# Patient Record
Sex: Female | Born: 1975 | Race: White | Hispanic: No | Marital: Married | Smoking: Never smoker
Health system: Southern US, Community
[De-identification: ages and names within clinical notes are randomized; demographics above are authoritative.]

## PROBLEM LIST (undated history)

## (undated) DIAGNOSIS — F909 Attention-deficit hyperactivity disorder, unspecified type: Secondary | ICD-10-CM

## (undated) DIAGNOSIS — F329 Major depressive disorder, single episode, unspecified: Secondary | ICD-10-CM

## (undated) DIAGNOSIS — I10 Essential (primary) hypertension: Secondary | ICD-10-CM

## (undated) DIAGNOSIS — F419 Anxiety disorder, unspecified: Secondary | ICD-10-CM

## (undated) DIAGNOSIS — G43909 Migraine, unspecified, not intractable, without status migrainosus: Secondary | ICD-10-CM

## (undated) DIAGNOSIS — K760 Fatty (change of) liver, not elsewhere classified: Secondary | ICD-10-CM

## (undated) HISTORY — DX: Anxiety disorder, unspecified: F41.9

## (undated) HISTORY — DX: Attention-deficit hyperactivity disorder, unspecified type: F90.9

## (undated) HISTORY — DX: Fatty (change of) liver, not elsewhere classified: K76.0

## (undated) HISTORY — DX: Essential (primary) hypertension: I10

## (undated) HISTORY — DX: Migraine, unspecified, not intractable, without status migrainosus: G43.909

---
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
## 1997-06-28 ENCOUNTER — Other Ambulatory Visit: Admission: RE | Admit: 1997-06-28 | Discharge: 1997-06-28 | Payer: Self-pay | Admitting: Obstetrics and Gynecology

## 1998-07-13 ENCOUNTER — Other Ambulatory Visit: Admission: RE | Admit: 1998-07-13 | Discharge: 1998-07-13 | Payer: Self-pay | Admitting: Obstetrics and Gynecology

## 1999-10-12 ENCOUNTER — Other Ambulatory Visit: Admission: RE | Admit: 1999-10-12 | Discharge: 1999-10-12 | Payer: Self-pay | Admitting: Obstetrics and Gynecology

## 2000-10-28 ENCOUNTER — Other Ambulatory Visit: Admission: RE | Admit: 2000-10-28 | Discharge: 2000-10-28 | Payer: Self-pay | Admitting: Obstetrics and Gynecology

## 2002-02-16 ENCOUNTER — Other Ambulatory Visit: Admission: RE | Admit: 2002-02-16 | Discharge: 2002-02-16 | Payer: Self-pay | Admitting: Gynecology

## 2003-10-26 ENCOUNTER — Other Ambulatory Visit: Admission: RE | Admit: 2003-10-26 | Discharge: 2003-10-26 | Payer: Self-pay | Admitting: Gynecology

## 2010-08-23 ENCOUNTER — Other Ambulatory Visit: Payer: Self-pay | Admitting: Family Medicine

## 2010-08-23 ENCOUNTER — Ambulatory Visit
Admission: RE | Admit: 2010-08-23 | Discharge: 2010-08-23 | Disposition: A | Discharge status: Home / Self Care | Payer: Commercial Managed Care - PPO | Source: Ambulatory Visit | Attending: Family Medicine | Admitting: Family Medicine

## 2010-08-23 DIAGNOSIS — R609 Edema, unspecified: Secondary | ICD-10-CM

## 2011-10-07 ENCOUNTER — Ambulatory Visit: Payer: BC Managed Care – PPO | Admitting: Physician Assistant

## 2011-10-07 VITALS — BP 124/84 | HR 80 | Temp 98.0°F | Resp 16 | Ht 64.5 in | Wt 238.0 lb

## 2011-10-07 DIAGNOSIS — R300 Dysuria: Secondary | ICD-10-CM

## 2011-10-07 DIAGNOSIS — R350 Frequency of micturition: Secondary | ICD-10-CM

## 2011-10-07 LAB — POCT UA - MICROSCOPIC ONLY
Casts, Ur, LPF, POC: NEGATIVE
Crystals, Ur, HPF, POC: NEGATIVE
Yeast, UA: NEGATIVE

## 2011-10-07 LAB — POCT URINALYSIS DIPSTICK
Nitrite, UA: NEGATIVE
Urobilinogen, UA: 0.2
pH, UA: 6.5

## 2011-10-07 MED ORDER — FLUCONAZOLE 150 MG PO TABS: 150.0000 mg | ORAL_TABLET | Freq: Once | ORAL | Status: DC

## 2011-10-07 MED ORDER — CIPROFLOXACIN HCL 500 MG PO TABS: 500.0000 mg | ORAL_TABLET | Freq: Two times a day (BID) | ORAL | Status: DC

## 2011-10-07 NOTE — Progress Notes (Signed)
  Subjective:    Patient ID: Tammy Bishop, female    DOB: [birth year], 36 y.o.   MRN: [id number]  HPI 36 year old female presents with 1 week history of urinary frequency, dysuria, and slight nausea. States symptoms initially started several weeks ago and she thought it may be a yeast infection and was treated for that.  Says symptoms improved initially and then returned.  Denies fever, chills, abdominal pain, back pain, or vomiting.    She also complains of a small lump on the right side of her neck. States it has been there for about 3 months. Non-tender, no warmth, erythema, or drainage.  Denies weight loss, bruising/bleeding or fatigue.  She otherwise is healthy.     Review of Systems  Constitutional: Negative for fever, chills and unexpected weight change.  HENT: Negative for trouble swallowing, neck pain and neck stiffness.   Gastrointestinal: Positive for nausea. Negative for vomiting and abdominal pain.  Genitourinary: Positive for dysuria, urgency and frequency. Negative for flank pain, vaginal pain and pelvic pain.  Skin: Negative for rash.  Neurological: Negative for headaches.  Hematological: Does not bruise/bleed easily.       Objective:   Physical Exam  Constitutional: She is oriented to person, place, and time. She appears well-developed and well-nourished.  HENT:  Head: Normocephalic and atraumatic.  Right Ear: External ear normal.  Left Ear: External ear normal.  Eyes: Conjunctivae normal are normal.  Neck: Normal range of motion. Neck supple.    Cardiovascular: Normal rate, regular rhythm and normal heart sounds.   Pulmonary/Chest: Effort normal and breath sounds normal.  Abdominal: Soft. Bowel sounds are normal. There is no tenderness (no CVA tenderness bilaterally). There is no rebound and no guarding.  Neurological: She is alert and oriented to person, place, and time.  Psychiatric: She has a normal mood and affect. Her behavior is normal. Judgment and  thought content normal.     Results for orders placed in visit on 10/07/11  POCT URINALYSIS DIPSTICK      Component Value Range   Color, UA yellow     Clarity, UA clear     Glucose, UA negative     Bilirubin, UA negative     Ketones, UA negative     Spec Grav, UA <=1.005     Blood, UA moderate     pH, UA 6.5     Protein, UA negative     Urobilinogen, UA 0.2     Nitrite, UA negative     Leukocytes, UA Trace    POCT UA - MICROSCOPIC ONLY      Component Value Range   WBC, Ur, HPF, POC 1-2     RBC, urine, microscopic 0-1     Bacteria, U Microscopic 1+     Mucus, UA negative     Epithelial cells, urine per micros 0-4     Crystals, Ur, HPF, POC negative     Casts, Ur, LPF, POC negative     Yeast, UA negative          Assessment & Plan:   1. Urinary frequency  POCT urinalysis dipstick, POCT UA - Microscopic Only, ciprofloxacin (CIPRO) 500 MG tablet, Urine culture  Start Cipro 500 mg bid Urine culture sent Rx for diflucan given to cover antibiotic associated yeast infection Follow up if symptoms worsen or fail to improve.

## 2011-10-10 LAB — URINE CULTURE: Colony Count: >=100000

## 2018-07-10 ENCOUNTER — Encounter (INDEPENDENT_AMBULATORY_CARE_PROVIDER_SITE_OTHER): Payer: Self-pay | Admitting: Ophthalmology

## 2018-07-10 ENCOUNTER — Other Ambulatory Visit: Payer: Self-pay

## 2018-07-10 ENCOUNTER — Ambulatory Visit (INDEPENDENT_AMBULATORY_CARE_PROVIDER_SITE_OTHER): Payer: Managed Care, Other (non HMO) | Admitting: Ophthalmology

## 2018-07-10 DIAGNOSIS — D3132 Benign neoplasm of left choroid: Secondary | ICD-10-CM | POA: Diagnosis not present

## 2018-07-10 DIAGNOSIS — H3581 Retinal edema: Secondary | ICD-10-CM | POA: Diagnosis not present

## 2018-07-10 DIAGNOSIS — G43109 Migraine with aura, not intractable, without status migrainosus: Secondary | ICD-10-CM | POA: Diagnosis not present

## 2018-07-10 DIAGNOSIS — H2513 Age-related nuclear cataract, bilateral: Secondary | ICD-10-CM | POA: Diagnosis not present

## 2018-07-10 NOTE — Progress Notes (Addendum)
[facility] Note  07/10/2018     CHIEF COMPLAINT Patient presents for Retina Evaluation   HISTORY OF PRESENT ILLNESS: Tammy Bishop is a 43 y.o. female who presents to the clinic today for:   HPI    Retina Evaluation    In both eyes.  This started 1 week ago.  Duration of 1 week.  Associated Symptoms Flashes, Floaters and Photophobia.  Context:  distance vision, mid-range vision, near vision, computer work and watching TV.  Treatments tried include no treatments.  I, the attending physician,  performed the HPI with the patient and updated documentation appropriately.          Comments    43 y/o female pt referred by [facility] for eval of floaters OU.  About 1 wk ago while looking at a computer screen at work, pt noticed sudden onset translucent floaters OU that started small, would gradually grow in size and be surrounded by flickering lights.  VA OU would become blurred during this time as well.  Symptoms would last for about 30 mins before resolving on their own.  This has happened two more times within the past week.  Pt doesn't recall doing anything that would have triggered symptoms, and nothing quite like this has happened in the past.  Last occurred about 1 day ago.  Pt not reporting having any symptoms this morning.  Pt has hx of ophthalmic migraines.  Denies pain.  No gtts.       Last edited by Bernarda Caffey, MD on 07/10/2018 10:29 AM. (History)     Patient states computer screen looks blurry "just a little piece", followed by flashes that eventually cover 40 % of vision. Lasts about 15-20 minutes and then stops. Has had 4 episodes in the past week. Patient has history of migraines in middle/high school. Had headaches and auras in vision at that time. Was put on blood pressure medication to help control symptoms in middle/high school. No known hormonal changes at this time.       Referring physician: [facility] [place],  [place] [id number]  HISTORICAL INFORMATION:   Selected notes from the MEDICAL RECORD NUMBER    CURRENT MEDICATIONS: No current outpatient medications on file. (Ophthalmic Drugs)   No current facility-administered medications for this visit.  (Ophthalmic Drugs)   Current Outpatient Medications (Other)  Medication Sig  . ALPRAZolam (XANAX) 0.5 MG tablet Take 0.5 mg by mouth at bedtime as needed.  . ciprofloxacin (CIPRO) 500 MG tablet Take 1 tablet (500 mg total) by mouth 2 (two) times daily.  . fluconazole (DIFLUCAN) 150 MG tablet Take 1 tablet (150 mg total) by mouth once. Repeat if needed  . metoprolol succinate (TOPROL-XL) 100 MG 24 hr tablet Take 100 mg by mouth at bedtime.   No current facility-administered medications for this visit.  (Other)      REVIEW OF SYSTEMS: ROS    Positive for: Eyes   Negative for: Constitutional, Gastrointestinal, Neurological, Skin, Genitourinary, Musculoskeletal, HENT, Endocrine, Cardiovascular, Respiratory, Psychiatric, Allergic/Imm, Heme/Lymph   Last edited by Matthew Folks, COA on 07/10/2018  9:44 AM. (History)       ALLERGIES No Known Allergies  PAST MEDICAL HISTORY History reviewed. No pertinent past medical history. History reviewed. No pertinent surgical history.  FAMILY HISTORY History reviewed. No pertinent family history.  SOCIAL HISTORY Social History   Tobacco Use  . Smoking status: Never Smoker  Substance Use Topics  .  Alcohol use: Not on file  . Drug use: Not on file         OPHTHALMIC EXAM:  Base Eye Exam    Visual Acuity (Snellen - Linear)      Right Left   Dist cc 20/15 -2 20/15 -2   Correction: Glasses       Tonometry (Tonopen, 9:45 AM)      Right Left   Pressure 12 14       Pupils      Dark Light Shape React APD   Right 3 2 Round Brisk None   Left 3 2 Round Brisk None       Visual Fields (Counting fingers)      Left Right    Full Full       Extraocular  Movement      Right Left    Full, Ortho Full, Ortho       Neuro/Psych    Oriented x3: Yes   Mood/Affect: Normal       Dilation    Both eyes: 1.0% Mydriacyl, 2.5% Phenylephrine @ 9:45 AM        Slit Lamp and Fundus Exam    Slit Lamp Exam      Right Left   Lids/Lashes Telangiectasia, mild MGD Telangiectasia, mild MGD   Conjunctiva/Sclera White and quiet White and quiet   Cornea mild PEE mild PEE   Anterior Chamber deep and clear deep and clear   Iris round and dilated round and dilated   Lens 1+NS, 1+CS 1+NS, 1+CS   Vitreous trace syneresis trace syneresis       Fundus Exam      Right Left   Disc compact, pink and sharp trace tilt, pink and sharp   C/D Ratio 0.1 0.2   Macula flat, excellent foveal reflex, no heme or edema flat, excellent foveal reflex, no heme or edema, irregular slightly pigmented choroidal nevus superotemporal macula 3DD, no SRF, no orange pigment, essentially flat   Vessels mild attenuation mild attenuation   Periphery attached, no heme attached, no heme        Refraction    Wearing Rx      Sphere Cylinder Axis   Right -2.00 +1.25 011   Left -2.50 +2.25 004   Age: 66yr   Type: SVL       Manifest Refraction      Sphere Cylinder Axis Dist VA   Right -2.00 +1.25 011 20/15-2   Left -2.50 +2.25 004 20/15-2          IMAGING AND PROCEDURES  Imaging and Procedures for @TODAY @  OCT, Retina - OU - Both Eyes       Right Eye Quality was good. Central Foveal Thickness: 261. Progression has no prior data. Findings include normal foveal contour, no IRF, no SRF (Normal study).   Left Eye Quality was good. Central Foveal Thickness: 272. Progression has no prior data. Findings include normal foveal contour, no IRF, no SRF, myopic contour (Myopic contour, hyperreflective choroidal lesion with overlying drusen/PED caught on wide scan along distal ST arcades).   Notes *Images captured and stored on drive  Diagnosis / Impression:  OD: NFP, no  IRF/SRF OS:  NFP, no IRF/SRF,myopic contour, choroidal nevus distal ST arcade  Clinical management:  See below  Abbreviations: NFP - Normal foveal profile. CME - cystoid macular edema. PED - pigment epithelial detachment. IRF - intraretinal fluid. SRF - subretinal fluid. EZ - ellipsoid zone. ERM - epiretinal membrane. ORA - outer  retinal atrophy. ORT - outer retinal tubulation. SRHM - subretinal hyper-reflective material                 ASSESSMENT/PLAN:    ICD-10-CM   1. 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 Ocular migraine without headache  - pt reports history of ocular migraines w/ headache during teens and early 68s, thought to be related to hormonal changes -- followed with a neurologist at that time  - recent episodes involves visual phenomena (flashes/floaters/moving lights and colors) lasting minutes, but no associated headache  - dilated eye exam without structural or anatomic findings that correspond to symptoms  - recommend Neurology consult if symptoms persist or worsen for further evaluation and management  - no ophthalmologic intervention indicated at this time  - monitor  2. Choroidal Nevus OS  - ~3DD pigmented choroidal lesion in temporal macula with trace elevation (<0.13m)  - +drusen/PED overlying; no orange pigment or SRF  - discussed findings and prognosis  - baseline OCT and color images obtained today 7.3.2020  - recommend monitoring for change  - f/u in 3-4 months  3. No retinal edema on exam or OCT  4. NS OU - The symptoms of cataract, surgical options, and treatments and risks were discussed with patient. - discussed diagnosis and progression - not yet visually significant - monitor for now   Ophthalmic Meds Ordered this visit:  No orders of the defined types were placed in this encounter.      Return  3-4 months, for DFE, OCT.  There are no Patient Instructions on file for this visit.   Explained the diagnoses, plan, and follow up with the patient and they expressed understanding.  Patient expressed understanding of the importance of proper follow up care.   BGardiner Sleeper M.D., Ph.D. Diseases & Surgery of the Retina and Vitreous [place] I have reviewed the above documentation for accuracy and completeness, and I agree with the above. BGardiner Sleeper M.D., Ph.D. 07/10/18 1:36 PM     Abbreviations: M myopia (nearsighted); A astigmatism; H hyperopia (farsighted); P presbyopia; Mrx spectacle prescription;  CTL contact lenses; OD right eye; OS left eye; OU both eyes  XT exotropia; ET esotropia; PEK punctate epithelial keratitis; PEE punctate epithelial erosions; DES dry eye syndrome; MGD meibomian gland dysfunction; ATs artificial tears; PFAT's preservative free artificial tears; [place] sclerotic cataract; PSC posterior subcapsular cataract; ERM epi-retinal membrane; PVD posterior vitreous detachment; RD retinal detachment; DM diabetes mellitus; DR diabetic retinopathy; NPDR non-proliferative diabetic retinopathy; PDR proliferative diabetic retinopathy; CSME clinically significant macular edema; DME diabetic macular edema; dbh dot blot hemorrhages; CWS cotton wool spot; POAG primary open angle glaucoma; C/D cup-to-disc ratio; HVF humphrey visual field; GVF goldmann visual field; OCT optical coherence tomography; IOP intraocular pressure; BRVO Branch retinal vein occlusion; CRVO central retinal vein occlusion; CRAO central retinal artery occlusion; BRAO branch retinal artery occlusion; RT retinal tear; SB scleral buckle; PPV pars plana vitrectomy; VH Vitreous hemorrhage; PRP panretinal laser photocoagulation; IVK intravitreal kenalog; VMT vitreomacular traction; MH Macular hole;  NVD neovascularization of the disc; NVE neovascularization elsewhere; AREDS age related  eye disease study; ARMD age related macular degeneration; POAG primary open angle glaucoma; EBMD epithelial/anterior basement membrane dystrophy; ACIOL anterior chamber intraocular lens; IOL intraocular lens;  PCIOL posterior chamber intraocular lens; Phaco/IOL phacoemulsification with intraocular lens placement; [place] photorefractive keratectomy; LASIK laser assisted in situ keratomileusis; HTN hypertension; DM diabetes mellitus; COPD chronic obstructive pulmonary disease

## 2018-08-11 ENCOUNTER — Other Ambulatory Visit: Payer: Self-pay

## 2018-08-11 ENCOUNTER — Encounter: Payer: Self-pay | Admitting: Neurology

## 2018-08-11 ENCOUNTER — Encounter: Payer: Self-pay | Admitting: *Deleted

## 2018-08-11 ENCOUNTER — Ambulatory Visit: Payer: Managed Care, Other (non HMO) | Admitting: Neurology

## 2018-08-11 DIAGNOSIS — G43109 Migraine with aura, not intractable, without status migrainosus: Secondary | ICD-10-CM | POA: Diagnosis not present

## 2018-08-11 DIAGNOSIS — R21 Rash and other nonspecific skin eruption: Secondary | ICD-10-CM | POA: Diagnosis not present

## 2018-08-11 DIAGNOSIS — H539 Unspecified visual disturbance: Secondary | ICD-10-CM | POA: Diagnosis not present

## 2018-08-11 NOTE — Progress Notes (Signed)
[facility]  PATIENT: Tammy Bishop DOB: 1976-01-03  REFERRING DOCTOR OR PCP:  Dr. Dagmar Hait SOURCE: patient, notes from Dr. Dagmar Hait  _________________________________   HISTORICAL  CHIEF COMPLAINT:  Chief Complaint  Patient presents with   New Patient (Initial Visit)    RM 12, alone. Paper referral from Dr. Dagmar Hait for migraines/floaters in visual field.    Migraine    Occular migraines started in 7th/8th grade. After she hit puberty, she was ok. She then started [facility] after getting married and HA's started again until they found the right BC. She usually gets about 2-3 migraines per year but now they have increased again. Has had 5-6 episodes in the last month. Last one this past Monday. Has left sided pressure. She is sleeping ok and no big stressors in life. She recently started new job which has been less stressful.     HISTORY OF PRESENT ILLNESS:  I had the pleasure of seeing your patient, Tammy Bishop, at [facility] for neurologic consultation regarding her episodes of visual disturbance.  She is a 43 year old woman with intermittent visual scotomata.   She started getting migraine headaches at puberty.   Initially migraines were severe but improved in high school.  They worsened again after starting birth control but then has gone many years without many migraine.  In middle school, she would get visual aura but then migraines occurred without aura.  However, she has had multiple visual aura the last 4-5 weeks.  Most start while she is at work on the computer.   She then gets visual changes and has truble reading the central vision.  She sees flashing lights move slowly like a snake and their areas increases in her central vision.    She gets 15 minutes of visual aura and then vision resolved.   Often the visual changes followed by left temple pressure sensation.   She has had only 8-9 in total, 6-7 in front of a computer and 2 in front of a TV.        She has seen ophthalmology and her exam ws normal.    She has no recent change in medication but her metoprolol increased from 50 to 100 mg in February.     She developed a rash 3-4 weeks ago, shortly after the first week of visual episodes.   The rash is painful.   Pain is better and now itches more than hurts.   Rash never blistered.   REVIEW OF SYSTEMS: Constitutional: No fevers, chills, sweats, or change in appetite Eyes: No visual changes, double vision, eye pain Ear, nose and throat: No hearing loss, ear pain, nasal congestion, sore throat Cardiovascular: No chest pain, palpitations Respiratory: No shortness of breath at rest or with exertion.   No wheezes GastrointestinaI: No nausea, vomiting, diarrhea, abdominal pain, fecal incontinence Genitourinary: No dysuria, urinary retention or frequency.  No nocturia. Musculoskeletal: No neck pain, back pain Integumentary: Has rash, pruritus Neurological: as above Psychiatric: No depression at this time.  No anxiety Endocrine: No palpitations, diaphoresis, change in appetite, change in weigh or increased thirst Hematologic/Lymphatic: No anemia, purpura, petechiae. Allergic/Immunologic: No itchy/runny eyes, nasal congestion, recent allergic reactions, rashes  ALLERGIES: No Known Allergies  HOME MEDICATIONS:  Current Outpatient Medications:    ALPRAZolam (XANAX) 0.5 MG tablet, Take 0.5 mg by mouth at bedtime as needed., Disp: , Rfl:    ibuprofen (ADVIL) 200 MG tablet, Take 200 mg by mouth every 6 (six) hours as  needed., Disp: , Rfl:    Isometheptene-Caffeine-APAP (PRODRIN) 65-20-325 MG TABS, Take 1 tablet by mouth as needed., Disp: , Rfl:    metoprolol succinate (TOPROL-XL) 100 MG 24 hr tablet, Take 100 mg by mouth at bedtime., Disp: , Rfl:   PAST MEDICAL HISTORY: Past Medical History:  Diagnosis Date   ADHD    Anxiety and depression    Fatty liver    Hypertension    Migraines     PAST SURGICAL  HISTORY: Past Surgical History:  Procedure Laterality Date   CESAREAN SECTION  2006   MOUTH SURGERY  1994    FAMILY HISTORY: Family History  Problem Relation Age of Onset   Hypertension Mother    Diabetes type II Mother    Diabetes type II Father    Liver disease Father    Colon cancer Maternal Grandfather    Fibromyalgia Paternal Grandmother    Breast cancer Maternal Aunt     SOCIAL HISTORY:  Social History   Socioeconomic History   Marital status: Married    Spouse name: Randall Hiss   Number of children: 2   Years of education: Not on file   Highest education level: Not on file  Occupational History   Not on file  Social Needs   Financial resource strain: Not on file   Food insecurity    Worry: Not on file    Inability: Not on file   Transportation needs    Medical: Not on file    Non-medical: Not on file  Tobacco Use   Smoking status: Never Smoker   Smokeless tobacco: Never Used  Substance and Sexual Activity   Alcohol use: Yes    Comment: 4-5 drinks per month   Drug use: Not on file   Sexual activity: Not on file  Lifestyle   Physical activity    Days per week: Not on file    Minutes per session: Not on file   Stress: Not on file  Relationships   Social connections    Talks on phone: Not on file    Gets together: Not on file    Attends religious service: Not on file    Active member of club or organization: Not on file    Attends meetings of clubs or organizations: Not on file    Relationship status: Not on file   Intimate partner violence    Fear of current or ex partner: Not on file    Emotionally abused: Not on file    Physically abused: Not on file    Forced sexual activity: Not on file  Other Topics Concern   Not on file  Social History Narrative   Right handed    Lives with husband, Randall Hiss   Caffeine use: 4-5 sodas per month   2 Green teas per day     PHYSICAL EXAM  Vitals:   08/11/18 1422  BP: 121/79  Pulse:  78  Temp: (!) 96.9 F (36.1 C)  Weight: 268 lb 8 oz (121.8 kg)  Height: 5' 3"  (1.6 m)    Body mass index is 47.56 kg/m.   General: The patient is well-developed and well-nourished and in no acute distress  HEENT:  Head is [place]/AT.  Sclera are anicteric.  Funduscopic exam shows normal optic discs and retinal vessels.  Neck: No carotid bruits are noted.  The neck is nontender.  Cardiovascular: The heart has a regular rate and rhythm with a normal S1 and S2. There were no murmurs, gallops or  rubs.    Skin: She has a patch of papular rash over the right neck (not distributed over a dermatome).  Extremities are without rash or  edema.  Musculoskeletal:  Back is nontender  Neurologic Exam  Mental status: The patient is alert and oriented x 3 at the time of the examination. The patient has apparent normal recent and remote memory, with an apparently normal attention span and concentration ability.   Speech is normal.  Cranial nerves: Extraocular movements are full. Pupils are equal, round, and reactive to light and accomodation.  Visual fields are full.  Facial symmetry is present. There is good facial sensation to soft touch bilaterally.Facial strength is normal.  Trapezius and sternocleidomastoid strength is normal. No dysarthria is noted.  The tongue is midline, and the patient has symmetric elevation of the soft palate. No obvious hearing deficits are noted.  Motor:  Muscle bulk is normal.   Tone is normal. Strength is  5 / 5 in all 4 extremities.   Sensory: Sensory testing is intact to pinprick, soft touch and vibration sensation in all 4 extremities.  Coordination: Cerebellar testing reveals good finger-nose-finger and heel-to-shin bilaterally.  Gait and station: Station is normal.   Gait is normal. Tandem gait is normal. Romberg is negative.   Reflexes: Deep tendon reflexes are symmetric and normal bilaterally.   Plantar responses are flexor.      ASSESSMENT AND PLAN  1.  Ophthalmic migraine   2. 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 Rash     In summary, Ms. Allcorn is a 43 year old woman with transient visual scotomata episodes consistent with acephalgic (ophthalmic) migraine.  This week she is only had one episode, less then was occurring over the previous weeks.  Hopefully the episodes will continue to become less frequent.  I did discuss with her that if the headaches become more frequent or significantly affect her at work that I would recommend that we start topiramate 50 mg, increasing the dose as needed.  For now, however, we will hold off on the treatment as the frequency has improved.  She will return to see me as needed but call if she has new or worsening neurologic symptoms.  Thank you for asking me to see Mrs. Kinder.  Please let me know if I can be of further assistance with her or other patients in the future.  If they flare get worse again Kaylin Schellenberg A. Felecia Shelling, MD, [facility] 03/14/9430, 7:61 PM Certified in Neurology, Clinical Neurophysiology, Sleep Medicine and Neuroimaging  [facility] [address] [place], [place] [address] [phone]

## 2018-10-12 ENCOUNTER — Encounter (INDEPENDENT_AMBULATORY_CARE_PROVIDER_SITE_OTHER): Payer: Managed Care, Other (non HMO) | Admitting: Ophthalmology

## 2020-11-27 ENCOUNTER — Other Ambulatory Visit: Payer: Self-pay | Admitting: Internal Medicine

## 2020-11-27 DIAGNOSIS — E785 Hyperlipidemia, unspecified: Secondary | ICD-10-CM

## 2020-12-25 ENCOUNTER — Ambulatory Visit
Admission: RE | Admit: 2020-12-25 | Discharge: 2020-12-25 | Disposition: A | Discharge status: Home / Self Care | Payer: No Typology Code available for payment source | Source: Ambulatory Visit | Attending: Internal Medicine | Admitting: Internal Medicine

## 2020-12-25 DIAGNOSIS — E785 Hyperlipidemia, unspecified: Secondary | ICD-10-CM

## 2021-08-06 ENCOUNTER — Other Ambulatory Visit (HOSPITAL_BASED_OUTPATIENT_CLINIC_OR_DEPARTMENT_OTHER): Payer: Self-pay | Admitting: Internal Medicine

## 2021-08-06 DIAGNOSIS — Z1231 Encounter for screening mammogram for malignant neoplasm of breast: Secondary | ICD-10-CM

## 2021-08-08 ENCOUNTER — Encounter (HOSPITAL_BASED_OUTPATIENT_CLINIC_OR_DEPARTMENT_OTHER): Payer: Self-pay

## 2021-08-08 ENCOUNTER — Ambulatory Visit (HOSPITAL_BASED_OUTPATIENT_CLINIC_OR_DEPARTMENT_OTHER)
Admission: RE | Admit: 2021-08-08 | Discharge: 2021-08-08 | Disposition: A | Discharge status: Home / Self Care | Payer: 59 | Source: Ambulatory Visit | Attending: Internal Medicine | Admitting: Internal Medicine

## 2021-08-08 DIAGNOSIS — Z1231 Encounter for screening mammogram for malignant neoplasm of breast: Secondary | ICD-10-CM | POA: Diagnosis not present

## 2021-08-10 ENCOUNTER — Other Ambulatory Visit: Payer: Self-pay | Admitting: Internal Medicine

## 2021-08-10 DIAGNOSIS — R928 Other abnormal and inconclusive findings on diagnostic imaging of breast: Secondary | ICD-10-CM

## 2021-08-23 ENCOUNTER — Ambulatory Visit: Payer: 59

## 2021-08-23 ENCOUNTER — Ambulatory Visit
Admission: RE | Admit: 2021-08-23 | Discharge: 2021-08-23 | Disposition: A | Discharge status: Home / Self Care | Payer: 59 | Source: Ambulatory Visit | Attending: Internal Medicine | Admitting: Internal Medicine

## 2021-08-23 DIAGNOSIS — R928 Other abnormal and inconclusive findings on diagnostic imaging of breast: Secondary | ICD-10-CM

## 2022-10-22 IMAGING — CT CT CARDIAC CORONARY ARTERY CALCIUM SCORE
3 series · 14 of 20 positions shown, 16 images · non-contrast
Comparison: None.

CLINICAL DATA: Hyperlipidemia

EXAM:
CT CARDIAC CORONARY ARTERY CALCIUM SCORE
TECHNIQUE: Non-contrast imaging through the heart was performed using
prospective ECG gating. Image post processing was performed on an
independent workstation, allowing for quantitative analysis of the
heart and coronary arteries. Note that this exam targets the heart
and the chest was not imaged in its entirety.

[Series 2: calcium scoring 2.00 qr36 bestdiast 71% hrt calciu · axial · 0.40mm/px · z∈[+1759,+1829]mm · 4 of 59 slices shown]
[im 12/59  vessel]
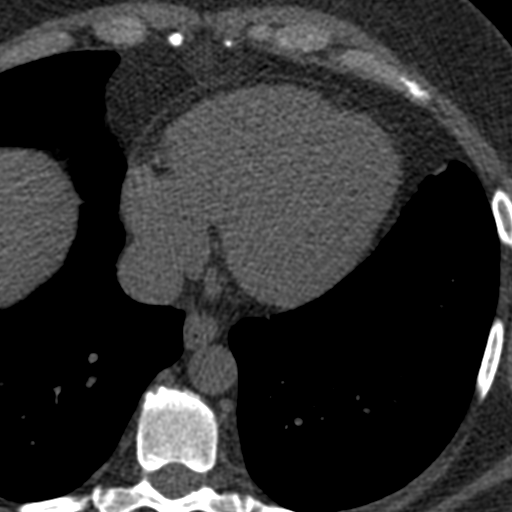
[im 24/59  vessel]
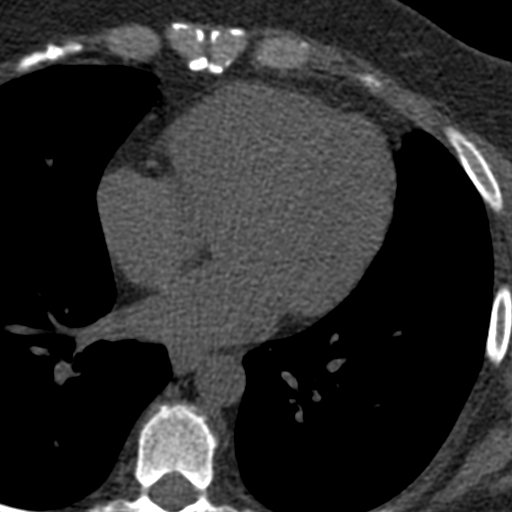
[im 35/59  vessel]
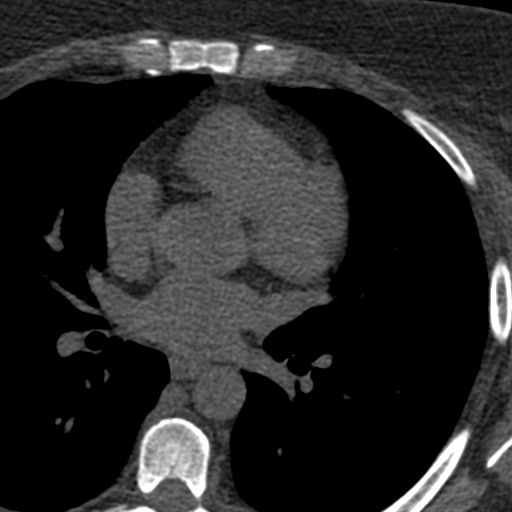
[im 47/59  vessel]
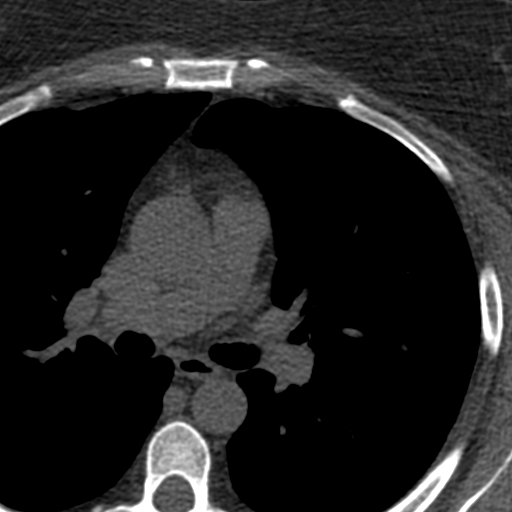

[Series 3: calcium scoring 2.00 br40 bestdiast 71% axial · axial · 0.61mm/px · z∈[+1755,+1833]mm · 5 of 59 slices shown, 7 images]
[im 10/59  vessel]
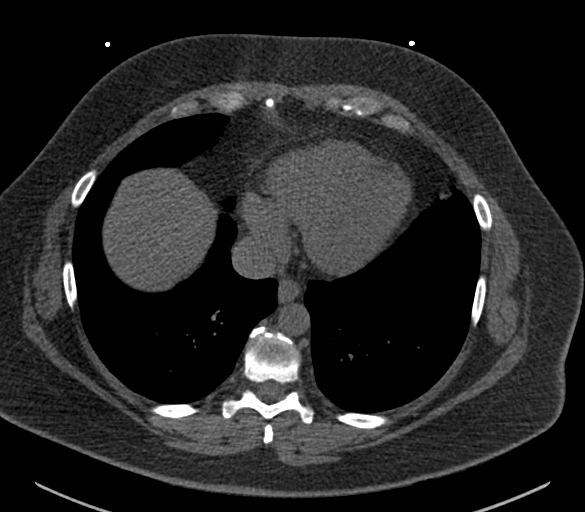
[im 10/59  lung]
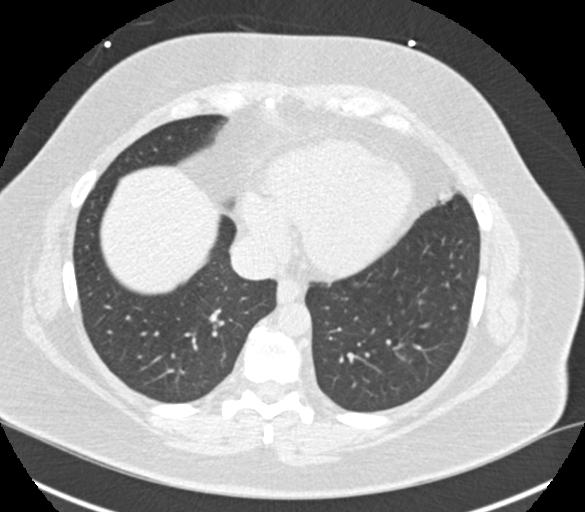
[im 20/59  vessel]
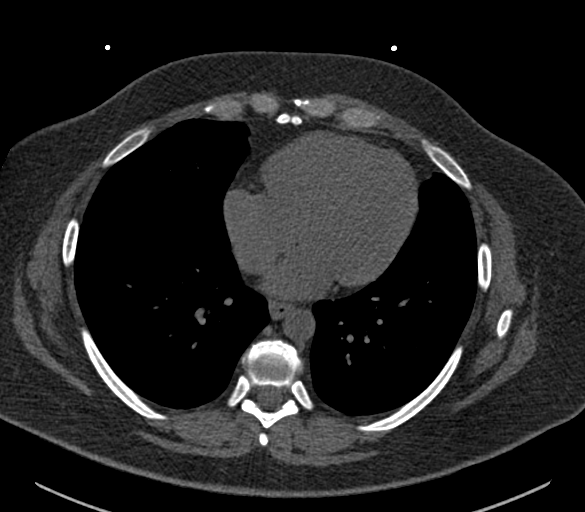
[im 30/59  vessel]
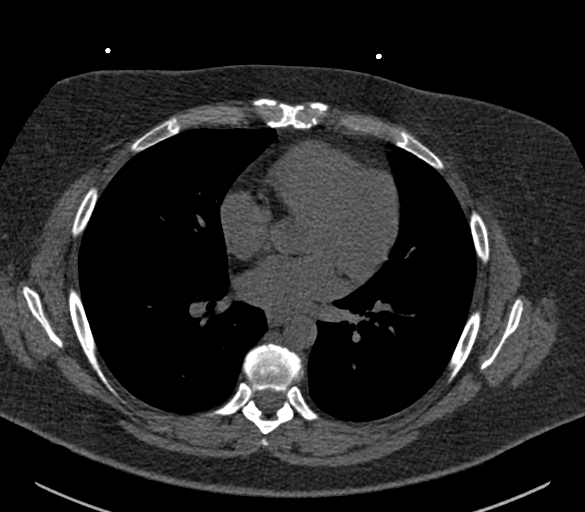
[im 39/59  vessel]
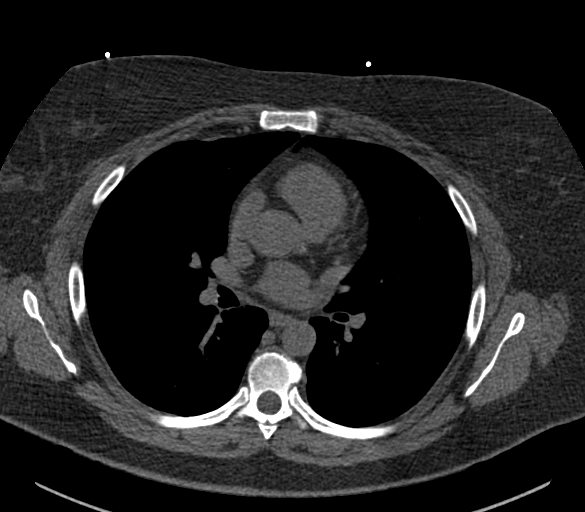
[im 49/59  vessel]
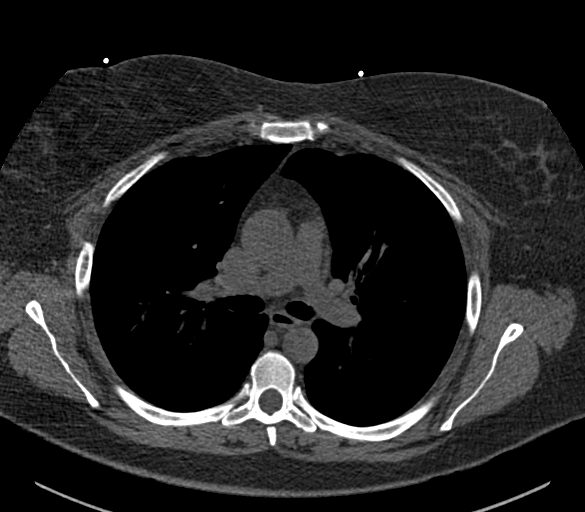
[im 49/59  lung]
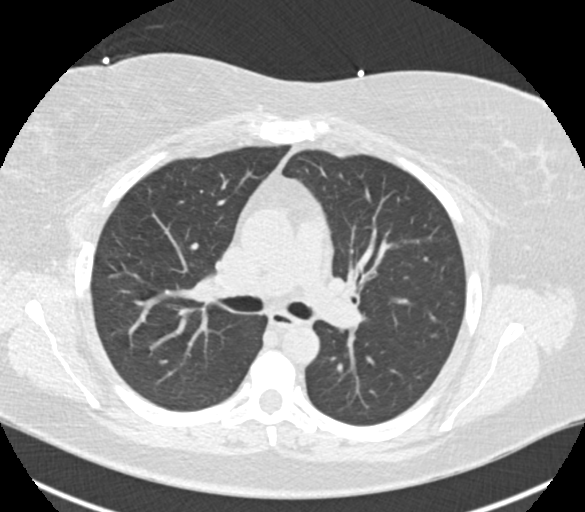

[Series 9: calcium scoring 2.00 br60 bestdiast 71% lungs · axial · 0.61mm/px · z∈[+1755,+1833]mm · 5 of 59 slices shown]
[im 10/59  vessel]
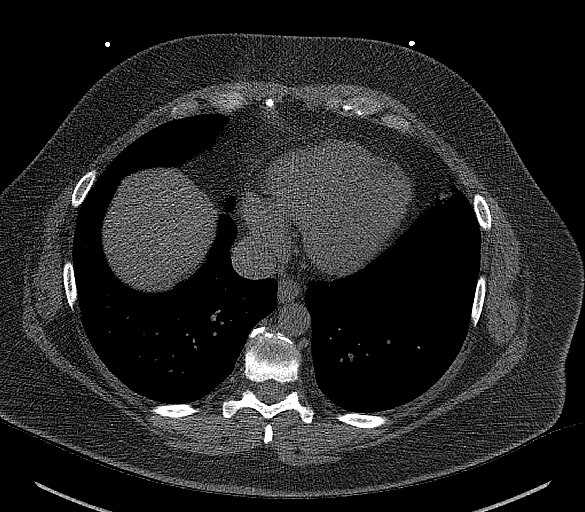
[im 20/59  vessel]
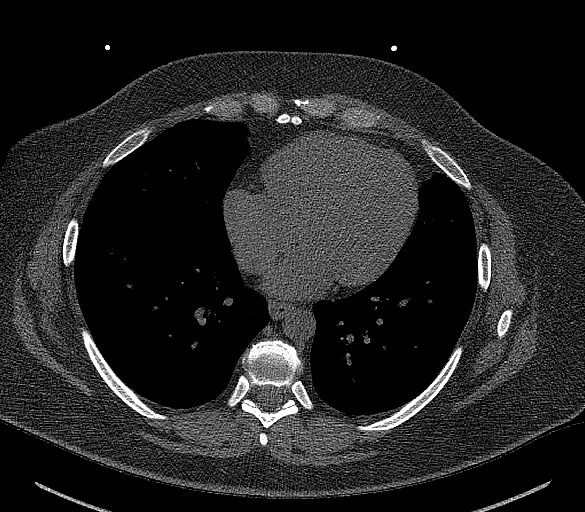
[im 30/59  vessel]
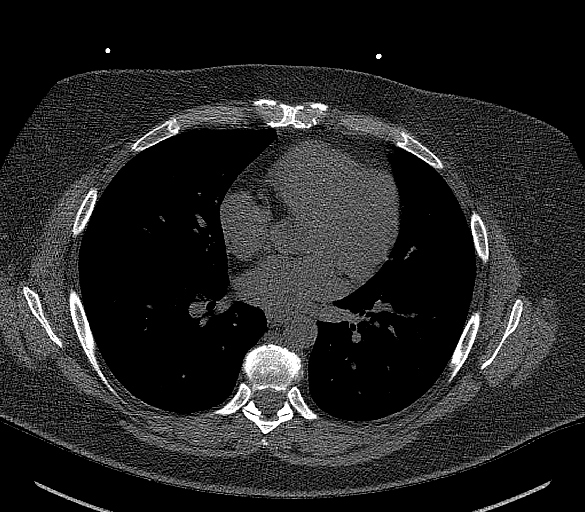
[im 39/59  vessel]
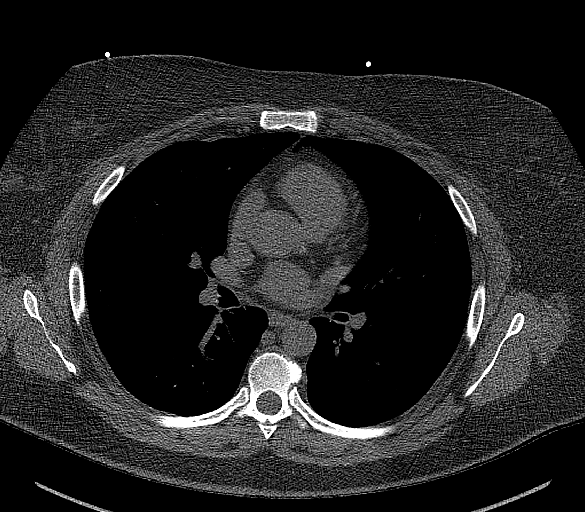
[im 49/59  vessel]
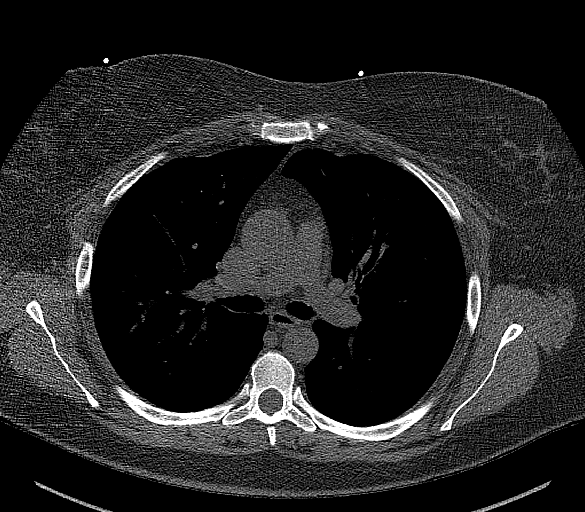

[14 of 20 positions shown; findings below may reference images not displayed]

FINDINGS: CORONARY CALCIUM SCORES:

Left Main: 0

LAD: 0

LCx: 0

RCA: 0

Total Agatston Score: 0

[HOSPITAL] percentile: 0

AORTA MEASUREMENTS:

Ascending Aorta: 31 mm

Descending Aorta: 20 mm

OTHER FINDINGS:

Heart is normal size. Aorta normal caliber. No adenopathy. No
confluent airspace opacities or effusions. Imaging into the upper
abdomen demonstrates no acute findings. Chest wall soft tissues are
unremarkable. No acute bony abnormality.
IMPRESSION: No visible coronary artery calcifications. Total coronary calcium
score of 0.

No acute or significant extracardiac abnormality.

## 2023-03-04 ENCOUNTER — Other Ambulatory Visit: Payer: Self-pay | Admitting: Internal Medicine

## 2023-03-04 DIAGNOSIS — K760 Fatty (change of) liver, not elsewhere classified: Secondary | ICD-10-CM

## 2023-03-06 ENCOUNTER — Ambulatory Visit
Admission: RE | Admit: 2023-03-06 | Discharge: 2023-03-06 | Disposition: A | Discharge status: Home / Self Care | Payer: 59 | Source: Ambulatory Visit | Attending: Internal Medicine | Admitting: Internal Medicine

## 2023-03-06 DIAGNOSIS — K760 Fatty (change of) liver, not elsewhere classified: Secondary | ICD-10-CM
